# Patient Record
Sex: Male | Born: 1985 | Hispanic: Yes | Marital: Single | Smoking: Never smoker
Health system: Southern US, Community
[De-identification: ages and names within clinical notes are randomized; demographics above are authoritative.]

## PROBLEM LIST (undated history)

## (undated) HISTORY — PX: GALLBLADDER SURGERY: SHX652

---

## 2007-01-08 ENCOUNTER — Emergency Department: Payer: Self-pay | Admitting: Emergency Medicine

## 2007-01-10 ENCOUNTER — Emergency Department: Payer: Self-pay | Admitting: Emergency Medicine

## 2010-12-20 ENCOUNTER — Emergency Department: Payer: Self-pay | Admitting: Unknown Physician Specialty

## 2010-12-21 ENCOUNTER — Ambulatory Visit: Payer: Self-pay | Admitting: Family Medicine
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
## 2013-08-25 ENCOUNTER — Ambulatory Visit: Payer: Self-pay | Admitting: Emergency Medicine

## 2015-11-09 DIAGNOSIS — G8929 Other chronic pain: Secondary | ICD-10-CM | POA: Insufficient documentation

## 2017-11-23 ENCOUNTER — Other Ambulatory Visit: Payer: Self-pay

## 2017-11-23 ENCOUNTER — Emergency Department: Payer: Managed Care, Other (non HMO)

## 2017-11-23 ENCOUNTER — Emergency Department
Admission: EM | Admit: 2017-11-23 | Discharge: 2017-11-23 | Disposition: A | Discharge status: Home / Self Care | Payer: Managed Care, Other (non HMO) | Attending: Emergency Medicine | Admitting: Emergency Medicine

## 2017-11-23 DIAGNOSIS — K8020 Calculus of gallbladder without cholecystitis without obstruction: Secondary | ICD-10-CM | POA: Diagnosis not present

## 2017-11-23 DIAGNOSIS — R1013 Epigastric pain: Secondary | ICD-10-CM | POA: Insufficient documentation

## 2017-11-23 LAB — LIPASE, BLOOD: LIPASE: 22 U/L (ref 11–51)

## 2017-11-23 LAB — COMPREHENSIVE METABOLIC PANEL
ALT: 123 U/L — AB (ref 0–44)
ANION GAP: 9 (ref 5–15)
AST: 44 U/L — ABNORMAL HIGH (ref 15–41)
Albumin: 5 g/dL (ref 3.5–5.0)
Alkaline Phosphatase: 91 U/L (ref 38–126)
BILIRUBIN TOTAL: 0.5 mg/dL (ref 0.3–1.2)
BUN: 17 mg/dL (ref 6–20)
CO2: 28 mmol/L (ref 22–32)
CREATININE: 1.16 mg/dL (ref 0.61–1.24)
Calcium: 9.7 mg/dL (ref 8.9–10.3)
Chloride: 104 mmol/L (ref 98–111)
GFR calc non Af Amer: >60 mL/min (ref >60)
GLUCOSE: 133 mg/dL — AB (ref 70–99)
Potassium: 3.5 mmol/L (ref 3.5–5.1)
SODIUM: 141 mmol/L (ref 135–145)
TOTAL PROTEIN: 8.4 g/dL — AB (ref 6.5–8.1)

## 2017-11-23 LAB — CBC WITH DIFFERENTIAL/PLATELET
Basophils Absolute: 0.1 10*3/uL (ref 0–0.1)
Basophils Relative: 1 %
Eosinophils Absolute: 0 10*3/uL (ref 0–0.7)
Eosinophils Relative: 1 %
HEMATOCRIT: 43.4 % (ref 40.0–52.0)
Hemoglobin: 15 g/dL (ref 13.0–18.0)
LYMPHS ABS: 1.8 10*3/uL (ref 1.0–3.6)
LYMPHS PCT: 19 %
MCH: 29.8 pg (ref 26.0–34.0)
MCHC: 34.7 g/dL (ref 32.0–36.0)
MCV: 85.8 fL (ref 80.0–100.0)
MONOS PCT: 7 %
Monocytes Absolute: 0.7 10*3/uL (ref 0.2–1.0)
NEUTROS ABS: 6.8 10*3/uL — AB (ref 1.4–6.5)
NEUTROS PCT: 72 %
Platelets: 311 10*3/uL (ref 150–440)
RBC: 5.06 MIL/uL (ref 4.40–5.90)
RDW: 13.4 % (ref 11.5–14.5)
WBC: 9.4 10*3/uL (ref 3.8–10.6)

## 2017-11-23 LAB — ETHANOL: Alcohol, Ethyl (B): <10 mg/dL (ref <10)

## 2017-11-23 LAB — TROPONIN I

## 2017-11-23 MED ORDER — OXYCODONE-ACETAMINOPHEN 5-325 MG PO TABS: 1.0000 | ORAL_TABLET | ORAL | 0 refills | Status: DC | PRN

## 2017-11-23 MED ORDER — ONDANSETRON HCL 4 MG/2ML IJ SOLN
4.0000 mg | Freq: Once | INTRAMUSCULAR | Status: AC
  Administered 2017-11-23: 4 mg via INTRAVENOUS
  Filled 2017-11-23: qty 2

## 2017-11-23 MED ORDER — FAMOTIDINE IN NACL 20-0.9 MG/50ML-% IV SOLN
20.0000 mg | Freq: Once | INTRAVENOUS | Status: AC
  Administered 2017-11-23: 20 mg via INTRAVENOUS
  Filled 2017-11-23: qty 50

## 2017-11-23 MED ORDER — ONDANSETRON HCL 4 MG/2ML IJ SOLN
4.0000 mg | Freq: Once | INTRAMUSCULAR | Status: AC
  Administered 2017-11-23: 4 mg via INTRAVENOUS

## 2017-11-23 MED ORDER — FENTANYL CITRATE (PF) 100 MCG/2ML IJ SOLN
50.0000 ug | Freq: Once | INTRAMUSCULAR | Status: AC
  Administered 2017-11-23: 50 ug via INTRAVENOUS

## 2017-11-23 MED ORDER — ONDANSETRON 4 MG PO TBDP: 4.0000 mg | ORAL_TABLET | Freq: Three times a day (TID) | ORAL | 0 refills | Status: DC | PRN

## 2017-11-23 MED ORDER — OXYCODONE-ACETAMINOPHEN 5-325 MG PO TABS
2.0000 | ORAL_TABLET | Freq: Once | ORAL | Status: AC
  Administered 2017-11-23: 2 via ORAL
  Filled 2017-11-23: qty 2

## 2017-11-23 MED ORDER — HYDROMORPHONE HCL 1 MG/ML IJ SOLN
0.5000 mg | Freq: Once | INTRAMUSCULAR | Status: AC
  Administered 2017-11-23: 0.5 mg via INTRAVENOUS
  Filled 2017-11-23: qty 1

## 2017-11-23 MED ORDER — ONDANSETRON HCL 4 MG/2ML IJ SOLN
INTRAMUSCULAR | Status: AC
  Filled 2017-11-23: qty 2

## 2017-11-23 MED ORDER — FENTANYL CITRATE (PF) 100 MCG/2ML IJ SOLN
INTRAMUSCULAR | Status: AC
  Filled 2017-11-23: qty 2

## 2017-11-23 MED ORDER — SODIUM CHLORIDE 0.9 % IV BOLUS
1000.0000 mL | Freq: Once | INTRAVENOUS | Status: AC
  Administered 2017-11-23: 1000 mL via INTRAVENOUS

## 2017-11-23 NOTE — ED Triage Notes (Signed)
Pt in with co abd pain states ate a lot of chicken wings today. Has made himself vomit, pain is to epigastric area.

## 2017-11-23 NOTE — ED Notes (Signed)
Patient transported to Ultrasound 

## 2017-11-23 NOTE — ED Provider Notes (Signed)
[facility] Emergency Department Provider Note   ____________________________________________   None    (approximate)  I have reviewed the triage vital signs and the nursing notes.   HISTORY  Chief Complaint Abdominal Pain    HPI Cody Dougherty is a 32 y.o. male who presents to the ED from home with a chief complaint of abdominal pain.  Patient reports he ate a lot of spicy chicken wings tonight.  Developed sharp epigastric pain approximately 10 PM.  Self-induced vomiting without relief.  Also took Alka-Seltzer without relief of symptoms.  Denies associated fever, chills, chest pain, shortness of breath, diarrhea.  Denies recent travel or trauma.   Past medical history None  There are no active problems to display for this patient.    Prior to Admission medications   Not on File    Allergies Patient has no allergy information on record.  No family history on file.  Social History Social History   Tobacco Use  . Smoking status: Not on file  Substance Use Topics  . Alcohol use: Not on file  . Drug use: Not on file  Denies recent alcohol use  Review of Systems  Constitutional: No fever/chills Eyes: No visual changes. ENT: No sore throat. Cardiovascular: Denies chest pain. Respiratory: Denies shortness of breath. Gastrointestinal: Positive for abdominal pain.  No nausea, no vomiting.  No diarrhea.  No constipation. Genitourinary: Negative for dysuria. Musculoskeletal: Negative for back pain. Skin: Negative for rash. Neurological: Negative for headaches, focal weakness or numbness.   ____________________________________________   PHYSICAL EXAM:  VITAL SIGNS: ED Triage Vitals  Enc Vitals Group     BP 11/23/17 0046 135/79     Pulse Rate 11/23/17 0045 (!) 53     Resp --      Temp 11/23/17 0045 97.7 F (36.5 C)     Temp Source 11/23/17 0045 Oral     SpO2 11/23/17 0045 98 %     Weight 11/23/17 0045 170 lb (77.1 kg)   Height 11/23/17 0045 5\' 5"  (1.651 m)     Head Circumference --      Peak Flow --      Pain Score 11/23/17 0045 9     Pain Loc --      Pain Edu? --      Excl. in GC? --     Constitutional: Alert and oriented.  Uncomfortable appearing and in mild acute distress. Eyes: Conjunctivae are normal. PERRL. EOMI. Head: Atraumatic. Nose: No congestion/rhinnorhea. Mouth/Throat: Mucous membranes are moist.  Oropharynx non-erythematous. Neck: No stridor.   Cardiovascular: Normal rate, regular rhythm. Grossly normal heart sounds.  Good peripheral circulation. Respiratory: Normal respiratory effort.  No retractions. Lungs CTAB. Gastrointestinal: Soft and tender to palpation epigastrium without rebound or guarding.  Small reducible umbilical hernia. No distention. No abdominal bruits. No CVA tenderness. Musculoskeletal: No lower extremity tenderness nor edema.  No joint effusions. Neurologic:  Normal speech and language. No gross focal neurologic deficits are appreciated. No gait instability. Skin:  Skin is warm, dry and intact. No rash noted. Psychiatric: Mood and affect are normal. Speech and behavior are normal.  ____________________________________________   LABS (all labs ordered are listed, but only abnormal results are displayed)  Labs Reviewed  CBC WITH DIFFERENTIAL/PLATELET - Abnormal; Notable for the following components:      Result Value   Neutro Abs 6.8 (*)    All other components within normal limits  COMPREHENSIVE METABOLIC PANEL - Abnormal; Notable for the following components:  Glucose, Bld 133 (*)    Total Protein 8.4 (*)    AST 44 (*)    ALT 123 (*)    All other components within normal limits  ETHANOL  LIPASE, BLOOD  TROPONIN I   ____________________________________________  EKG  ED ECG REPORT I, Mariany Mackintosh J, the attending physician, personally viewed and interpreted this ECG.   Date: 11/23/2017  EKG Time: 0049  Rate: 54  Rhythm: sinus bradycardia  Axis:  Normal  Intervals:none  ST&T Change: Nonspecific  ____________________________________________  RADIOLOGY  ED MD interpretation: Cholelithiasis, stone at gallbladder neck  Official radiology report(s): US Abdomen Limited Ruq  Result Date: 11/23/2017 CLINICAL DATA:  Epigastric pain 45 hours after eating hot wings EXAM: ULTRASOUND ABDOMEN LIMITED RIGHT UPPER QUADRANT COMPARISON:  01/10/2007 FINDINGS: Gallbladder: Shadowing calcified gallstones within gallbladder measuring up to 21 mm diameter. A 14 mm diameter stone is fixed in position at the gallbladder neck, non mobile. No gallbladder wall thickening, or pericholecystic fluid. Patient medicated, unable to accurately assess for presence of a sonographic Murphy sign. Common bile duct: Diameter: 3 mm diameter, normal Liver: Echogenic parenchyma, likely fatty infiltration though this can be seen with cirrhosis and certain infiltrative disorders. Areas of hypoechogenicity are seen within liver parenchyma adjacent to gallbladder fossa likely reflecting focal sparing. No definite hepatic mass lesion. Portal vein is patent on color Doppler imaging with normal direction of blood flow towards the liver. No RIGHT upper quadrant free fluid. IMPRESSION: Cholelithiasis with a 14 mm diameter stone fixed in position/non mobile at the gallbladder neck. No definite sonographic signs of acute cholecystitis. Fatty infiltration of liver with focal sparing adjacent to gallbladder fossa. Electronically Signed   By: Ulyses Southward M.D.   On: 11/23/2017 03:10    ____________________________________________   PROCEDURES  Procedure(s) performed: None  Procedures  Critical Care performed: No  ____________________________________________   INITIAL IMPRESSION / ASSESSMENT AND PLAN / ED COURSE  As part of my medical decision making, I reviewed the following data within the electronic MEDICAL RECORD NUMBER Nursing notes reviewed and incorporated, Labs reviewed, EKG  interpreted, Radiograph reviewed and Notes from prior ED visits   32 year old otherwise healthy male who presents with epigastric discomfort after eating spicy chicken wings. Differential diagnosis includes, but is not limited to, biliary disease (biliary colic, acute cholecystitis, cholangitis, choledocholithiasis, etc), intrathoracic causes for epigastric abdominal pain including ACS, gastritis, duodenitis, pancreatitis, small bowel or large bowel obstruction, abdominal aortic aneurysm, hernia, and ulcer(s).  Will obtain blood work to include LFTs and lipase, right upper quadrant abdominal ultrasound to evaluate for cholecystitis.  Initiate IV fluid resuscitation, 50 mcg IV fentanyl paired with 4 mg IV Zofran to be given for pain and nausea.  Will add 20 mg IV Pepcid for GERD.   Clinical Course as of Nov 24 338  Fri Nov 23, 2017  0147 Patient requesting additional pain medicine before going to ultrasound.   [JS]  0339 Pain better after IV Dilaudid.  At this time will administer oral Percocet.  Updated patient and spouse of laboratory and imaging results.  No prior blood work for comparison of minimally elevated transaminases.  Ultrasound does not demonstrate ductal obstruction.  Will discharge home on Percocet and Zofran to use as needed for pain and nausea.  Encouraged follow-up with general surgery.  Encouraged bland diet.  Strict return precautions given.  Both verbalize understanding and agree with plan of care.   [JS]    Clinical Course User Index [JS] Irean Hong, MD  ____________________________________________   FINAL CLINICAL IMPRESSION(S) / ED DIAGNOSES  Final diagnoses:  Epigastric pain  Calculus of gallbladder without cholecystitis without obstruction     ED Discharge Orders    None       Note:  This document was prepared using Dragon voice recognition software and may include unintentional dictation errors.    Irean HongSung, Arion Shankles J, MD 11/23/17 [phone]

## 2017-11-23 NOTE — ED Notes (Signed)
Patient called asking for referral to [facility].  He gave me fax number.  Called [facility], and fax he  Had is for urology.  I faxed the records to general surgery.  I called the patient back and explained that the stones were in his gall bladder and that he needed general surgery for that.  I also gave him the number for general surgery.

## 2017-11-23 NOTE — ED Notes (Signed)
Pt. Verbalizes understanding of d/c instructions, medications, and follow-up. VS stable.  Pt. In NAD at time of d/c and denies further concerns regarding this visit. Pt. Stable at the time of departure from the unit, departing unit by the safest and most appropriate manner per that pt condition and limitations with all belongings accounted for. Pt advised to return to the ED at any time for emergent concerns, or for new/worsening symptoms.   

## 2017-11-23 NOTE — ED Notes (Signed)
Pt given discharge instructions including prescriptions. Pt states understanding. Pt states receipt of all belongings.

## 2017-11-23 NOTE — Discharge Instructions (Addendum)
1. Take medicines as needed for pain & nausea (Percocet/Zofran #30). 2. Clear liquids x 12 hours, then bland diet x 1 week, then slowly advance diet as tolerated. Avoid fatty, greasy, spicy foods and drinks. 3. Return to the ER for worsening symptoms, persistent vomiting, fever, difficulty breathing or other concerns.  

## 2017-12-03 ENCOUNTER — Ambulatory Visit: Payer: Self-pay | Admitting: Surgery

## 2020-08-13 IMAGING — US US ABDOMEN LIMITED
1 series · 13 of 25 positions shown · non-contrast
Comparison: 01/10/2007

CLINICAL DATA: Epigastric pain 45 hours after eating hot wings

EXAM:
ULTRASOUND ABDOMEN LIMITED RIGHT UPPER QUADRANT

[Series 1: us abdomen limited · 0.25mm/px · 13 of 50 slices shown]
[im 1/50]
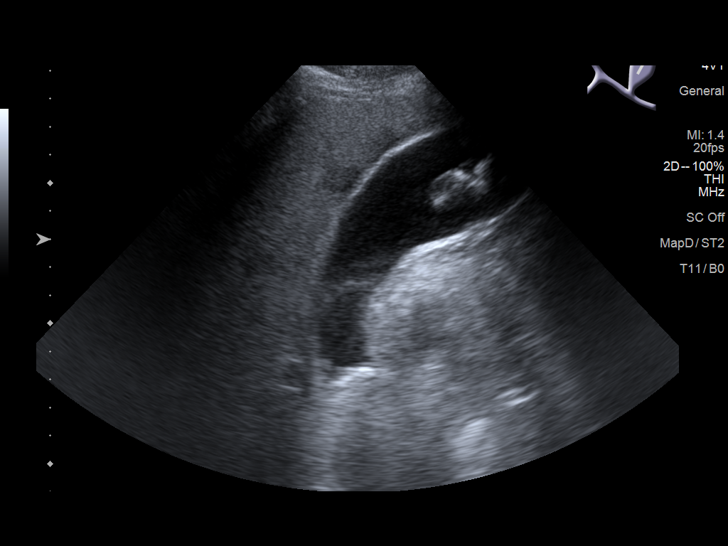
[im 5/50]
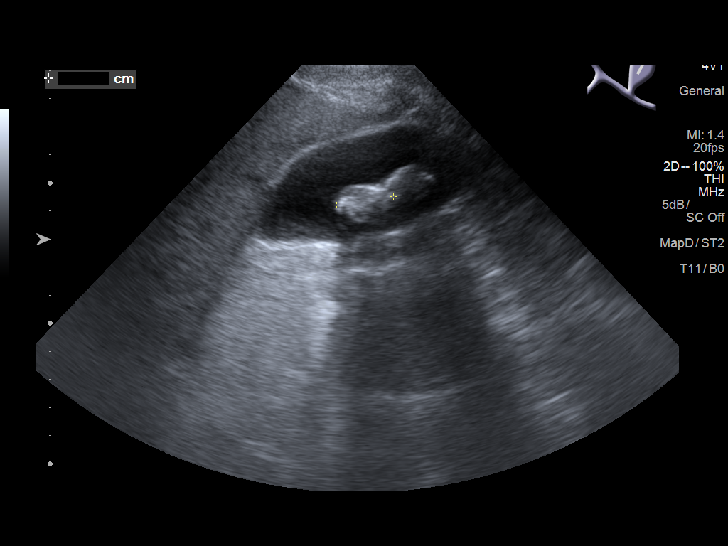
[im 9/50]
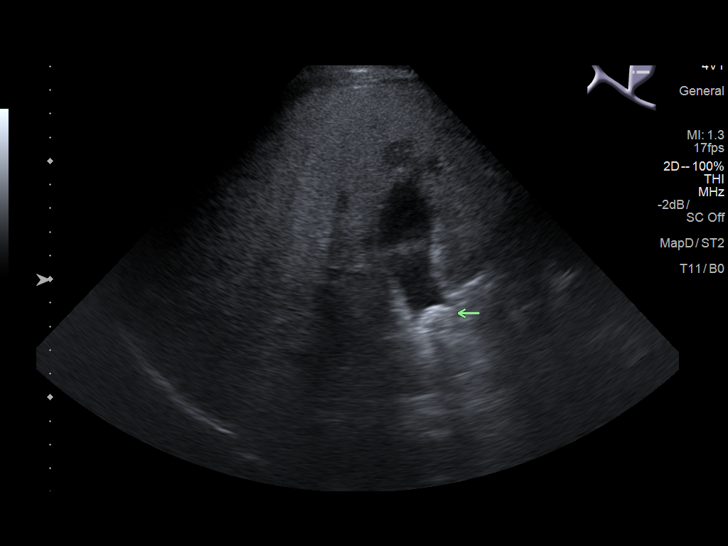
[im 13/50]
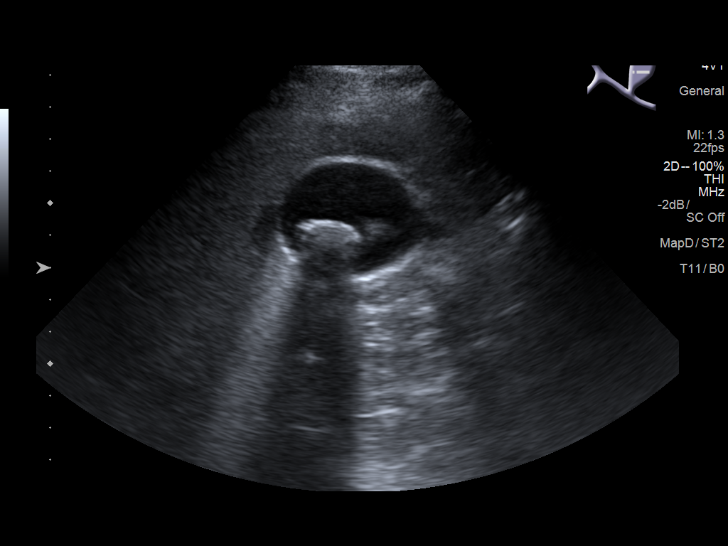
[im 17/50]
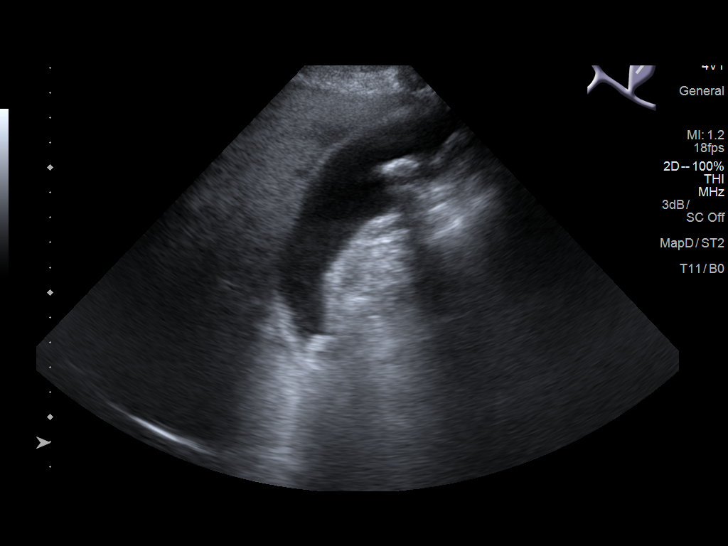
[im 21/50]
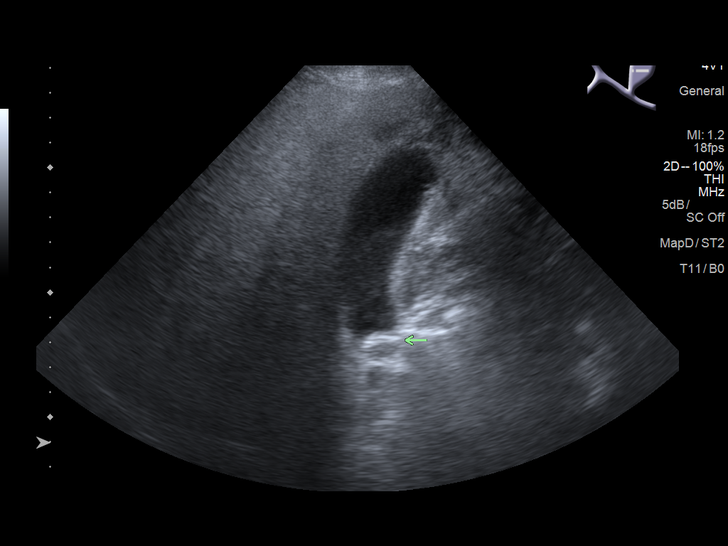
[im 25/50]
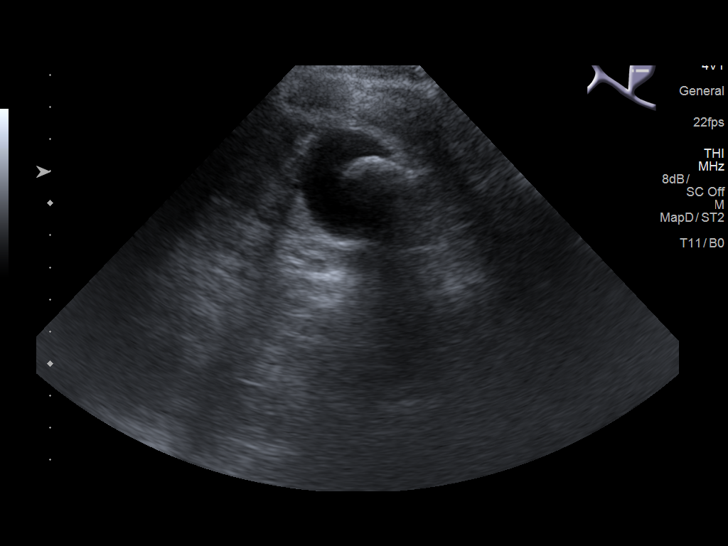
[im 29/50]
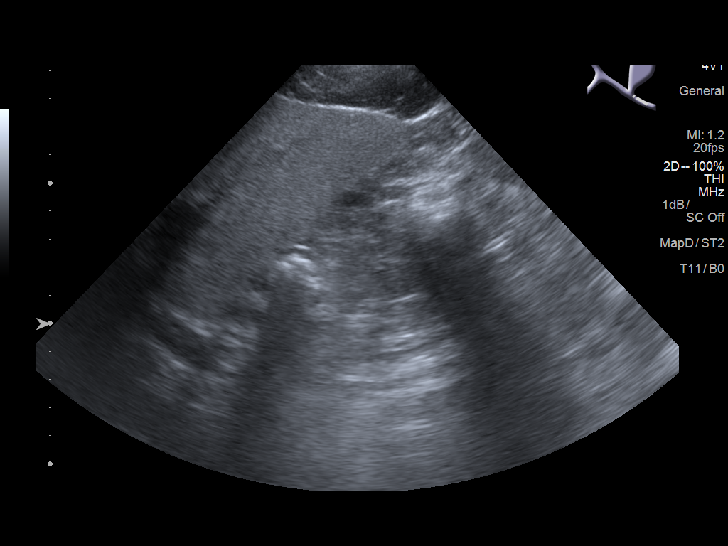
[im 33/50]
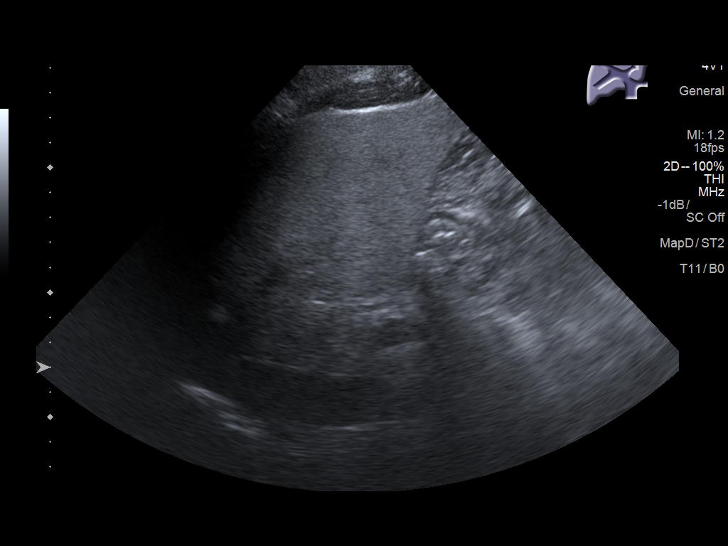
[im 37/50]
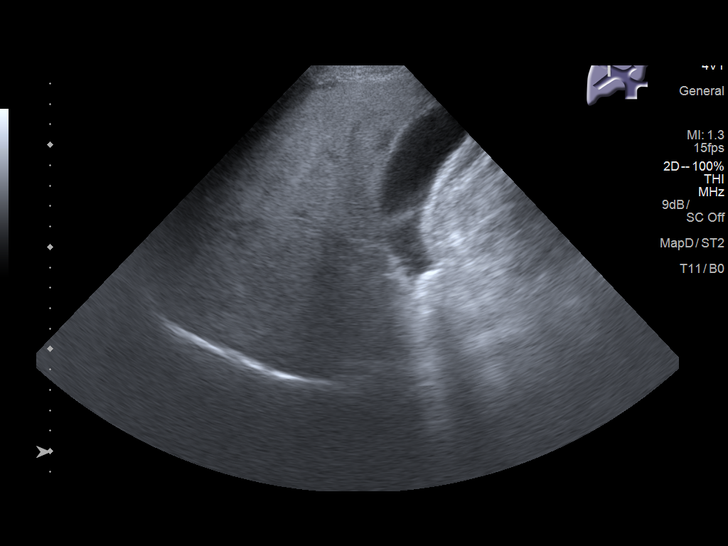
[im 41/50]
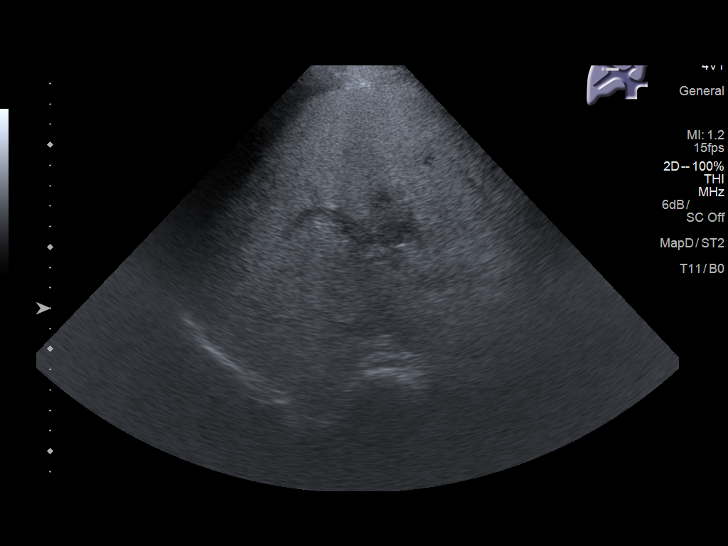
[im 45/50]
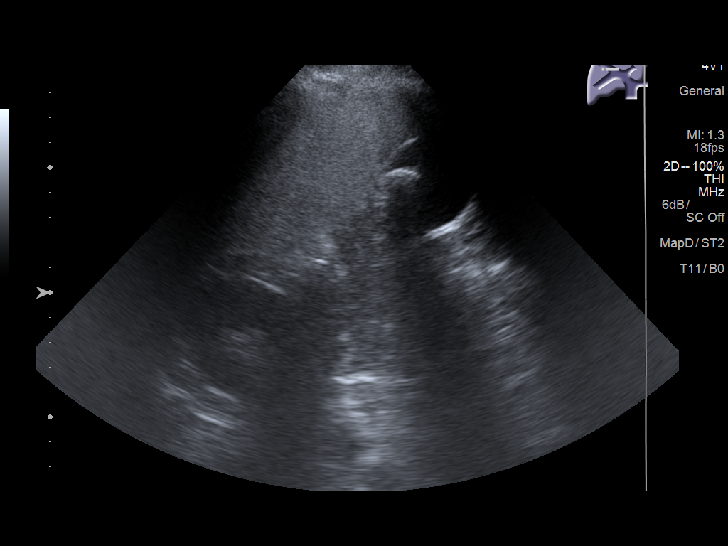
[im 50/50]
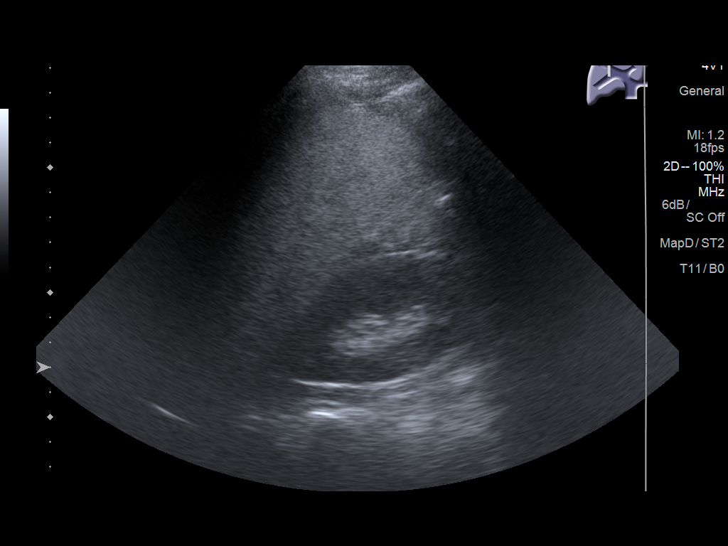

[13 of 25 positions shown; findings below may reference images not displayed]

FINDINGS: Gallbladder:

Shadowing calcified gallstones within gallbladder measuring up to 21
mm diameter. A 14 mm diameter stone is fixed in position at the
gallbladder neck, non mobile. No gallbladder wall thickening, or
pericholecystic fluid. Patient medicated, unable to accurately
assess for presence of a sonographic Murphy sign.

Common bile duct:

Diameter: 3 mm diameter, normal

Liver:

Echogenic parenchyma, likely fatty infiltration though this can be
seen with cirrhosis and certain infiltrative disorders. Areas of
hypoechogenicity are seen within liver parenchyma adjacent to
gallbladder fossa likely reflecting focal sparing. No definite
hepatic mass lesion. Portal vein is patent on color Doppler imaging
with normal direction of blood flow towards the liver.

No RIGHT upper quadrant free fluid.
IMPRESSION: Cholelithiasis with a 14 mm diameter stone fixed in position/non
mobile at the gallbladder neck.

No definite sonographic signs of acute cholecystitis.

Fatty infiltration of liver with focal sparing adjacent to
gallbladder fossa.

## 2021-01-10 ENCOUNTER — Other Ambulatory Visit: Payer: Self-pay

## 2021-01-10 ENCOUNTER — Ambulatory Visit
Admission: EM | Admit: 2021-01-10 | Discharge: 2021-01-10 | Disposition: A | Discharge status: Home / Self Care | Payer: Managed Care, Other (non HMO) | Attending: Emergency Medicine | Admitting: Emergency Medicine

## 2021-01-10 DIAGNOSIS — K649 Unspecified hemorrhoids: Secondary | ICD-10-CM

## 2021-01-10 MED ORDER — HYDROCORTISONE ACETATE 25 MG RE SUPP: 25.0000 mg | Freq: Two times a day (BID) | RECTAL | 0 refills | Status: AC

## 2021-01-10 NOTE — ED Provider Notes (Signed)
[facility]    CSN: [id number] Arrival date & time: 01/10/21  1287      History   Chief Complaint Chief Complaint  Patient presents with   Rectal Pain    HPI Cody Dougherty is a 35 y.o. male.   HPI  35 year old male here for evaluation of rectal pain.  Patient reports that he has been experiencing rectal pain for the last 2 days.  He states that he first developed pain after he strained hard to have a bowel movement.  This has not been associated with fever, blood in his stool or when he wipes, and he has not had any history of this previously.  He states that he does not have constipation, in fact it is exactly the opposite since he had his gallbladder out his stools are typically mushy.  No past medical history on file.  There are no problems to display for this patient.        Home Medications    Prior to Admission medications   Medication Sig Start Date End Date Taking? Authorizing Provider  hydrocortisone (ANUSOL-HC) 25 MG suppository Place 1 suppository (25 mg total) rectally 2 (two) times daily. 01/10/21  Yes Becky Augusta, NP    Family History No family history on file.  Social History Social History   Tobacco Use   Smoking status: Never   Smokeless tobacco: Never  Vaping Use   Vaping Use: Never used  Substance Use Topics   Alcohol use: Yes   Drug use: Never     Allergies   Patient has no allergy information on record.   Review of Systems Review of Systems  Constitutional:  Negative for activity change, appetite change and fever.  Gastrointestinal:  Positive for rectal pain. Negative for anal bleeding, blood in stool and constipation.  Hematological: Negative.   Psychiatric/Behavioral: Negative.      Physical Exam Triage Vital Signs ED Triage Vitals  Enc Vitals Group     BP 01/10/21 1153 129/76     Pulse Rate 01/10/21 1153 63     Resp 01/10/21 1153 18     Temp 01/10/21 1153 98.3 F (36.8 C)     Temp Source 01/10/21  1153 Oral     SpO2 01/10/21 1153 98 %     Weight 01/10/21 1151 170 lb (77.1 kg)     Height 01/10/21 1151 5\' 4"  (1.626 m)     Head Circumference --      Peak Flow --      Pain Score 01/10/21 1150 8     Pain Loc --      Pain Edu? --      Excl. in GC? --    No data found.  Updated Vital Signs BP 129/76 (BP Location: Left Arm)   Pulse 63   Temp 98.3 F (36.8 C) (Oral)   Resp 18   Ht 5\' 4"  (1.626 m)   Wt 170 lb (77.1 kg)   SpO2 98%   BMI 29.18 kg/m   Visual Acuity Right Eye Distance:   Left Eye Distance:   Bilateral Distance:    Right Eye Near:   Left Eye Near:    Bilateral Near:     Physical Exam Vitals and nursing note reviewed.  Constitutional:      General: He is not in acute distress.    Appearance: Normal appearance. He is normal weight. He is not ill-appearing.  HENT:     Head: Normocephalic and atraumatic.  Genitourinary:  Comments: Patient is a nonthrombosed hemorrhoid that extends from the 1:00 to the 3 o'clock position of his rectum externally.  No bleeding noted.  Area is markedly tender. Skin:    General: Skin is warm and dry.     Capillary Refill: Capillary refill takes less than 2 seconds.     Findings: No erythema or rash.  Neurological:     General: No focal deficit present.     Mental Status: He is alert and oriented to person, place, and time.  Psychiatric:        Mood and Affect: Mood normal.        Behavior: Behavior normal.        Thought Content: Thought content normal.        Judgment: Judgment normal.     UC Treatments / Results  Labs (all labs ordered are listed, but only abnormal results are displayed) Labs Reviewed - No data to display  EKG   Radiology No results found.  Procedures Procedures (including critical care time)  Medications Ordered in UC Medications - No data to display  Initial Impression / Assessment and Plan / UC Course  I have reviewed the triage vital signs and the nursing notes.  Pertinent labs &  imaging results that were available during my care of the patient were reviewed by me and considered in my medical decision making (see chart for details).  Patient is a nontoxic appearing 35 year old male here for evaluation of 2 days worth of rectal pain that developed after he strained hard to have a bowel movement.  He denies any blood when he wipes or blood in his stool.  He has not had any history of hemorrhoids in the past.  He states that he has not had constipation and that his stools are mostly mostly since having his gallbladder out.  Patient's physical exam reveals the presence of an external hemorrhoid that is nonthrombosed but is tender to touch.  It extends from 1 to 3 o'clock position of the anal opening.  Will treat patient with hydrocortisone suppositories to help decrease inflammation and aid in pain relief.  I suggested the patient to use Colace to ensure that he does not become constipated but he reports that he typically does not have that issue since having his gallbladder out as described above.  Patient advised that if his pain increases, he develops of bleeding from his rectum, or he develops a fever he needs to go to the ER for evaluation.   Final Clinical Impressions(s) / UC Diagnoses   Final diagnoses:  Hemorrhoids, unspecified hemorrhoid type     Discharge Instructions      Insert 1 hydrocortisone suppository in your rectum twice daily to decrease swelling and aid in pain relief.   Avoid straining or heavy lifting.  If you have any increase in pain, rectal bleeding, or develop a fever please go to the ER for evaluation.      ED Prescriptions     Medication Sig Dispense Auth. Provider   hydrocortisone (ANUSOL-HC) 25 MG suppository Place 1 suppository (25 mg total) rectally 2 (two) times daily. 12 suppository Becky Augusta, NP      PDMP not reviewed this encounter.   Becky Augusta, NP 01/10/21 1210

## 2021-01-10 NOTE — Discharge Instructions (Signed)
Insert 1 hydrocortisone suppository in your rectum twice daily to decrease swelling and aid in pain relief.   Avoid straining or heavy lifting.  If you have any increase in pain, rectal bleeding, or develop a fever please go to the ER for evaluation.

## 2021-01-10 NOTE — ED Triage Notes (Signed)
Pt here with C/o rectum pain for 2 days, thought there was rash back there. No bleeding.

## 2024-02-04 ENCOUNTER — Ambulatory Visit (INDEPENDENT_AMBULATORY_CARE_PROVIDER_SITE_OTHER): Admitting: Podiatry

## 2024-02-04 ENCOUNTER — Encounter: Payer: Self-pay | Admitting: Podiatry

## 2024-02-04 DIAGNOSIS — L600 Ingrowing nail: Secondary | ICD-10-CM | POA: Diagnosis not present

## 2024-02-04 MED ORDER — NEOMYCIN-POLYMYXIN-HC 3.5-10000-1 OT SUSP: OTIC | 0 refills | Status: AC

## 2024-02-04 NOTE — Progress Notes (Signed)
  Subjective:  Patient ID: Cody Dougherty, male    DOB: [birth year],  MRN: [id number]  Chief Complaint  Patient presents with   Ingrown Toenail    Left great toenail, lateral and medial border    38 y.o. male presents with the above complaint. History confirmed with patient.  He has had them partially removed before but they have recurred  Objective:  Physical Exam: warm, good capillary refill, no trophic changes or ulcerative lesions, normal DP and PT pulses, normal sensory exam, and ingrown left hallux medial and lateral border  Assessment:   1. Ingrowing left great toenail      Plan:  Patient was evaluated and treated and all questions answered.     Ingrown Nail, left -Patient elects to proceed with minor surgery to remove ingrown toenail today. Consent reviewed and signed by patient. -Ingrown nail excised. See procedure note. -Educated on post-procedure care including soaking. Written instructions provided and reviewed. -Rx for Cortisporin sent to pharmacy. -Advised on signs and symptoms of infection developing.  We discussed that the phenol likely will create some redness and edema and tenderness around the nailbed as long as it is localized this is to be expected.  Will return as needed if any infection signs develop  Procedure: Excision of Ingrown Toenail Location: Left 1st toe medial and lateral nail borders. Anesthesia: Lidocaine 1% plain; 1.5 mL and Marcaine 0.5% plain; 1.5 mL, digital block. Skin Prep: Betadine. Dressing: Silvadene; telfa; dry, sterile, compression dressing. Technique: Following skin prep, the toe was exsanguinated and a tourniquet was secured at the base of the toe. The affected nail border was freed, split with a nail splitter, and excised. Chemical matrixectomy was then performed with phenol and irrigated out with alcohol. The tourniquet was then removed and sterile dressing applied. Disposition: Patient tolerated procedure well.    No follow-ups  on file.

## 2024-02-04 NOTE — Patient Instructions (Addendum)
